# Patient Record
Sex: Male | Born: 1970 | Race: White | Hispanic: No | Marital: Married | Smoking: Never smoker
Health system: Southern US, Community
[De-identification: ages and names within clinical notes are randomized; demographics above are authoritative.]

## PROBLEM LIST (undated history)

## (undated) DIAGNOSIS — N200 Calculus of kidney: Secondary | ICD-10-CM

## (undated) HISTORY — PX: OTHER SURGICAL HISTORY: SHX169

## (undated) HISTORY — PX: HERNIA REPAIR: SHX51

---

## 2013-01-05 ENCOUNTER — Ambulatory Visit (INDEPENDENT_AMBULATORY_CARE_PROVIDER_SITE_OTHER): Payer: 59 | Admitting: Internal Medicine

## 2013-01-05 VITALS — BP 122/88 | HR 72 | Temp 98.6°F | Resp 16 | Ht 66.75 in | Wt 186.4 lb

## 2013-01-05 DIAGNOSIS — Z0000 Encounter for general adult medical examination without abnormal findings: Secondary | ICD-10-CM

## 2013-01-05 DIAGNOSIS — Z23 Encounter for immunization: Secondary | ICD-10-CM

## 2013-01-05 DIAGNOSIS — Z79899 Other long term (current) drug therapy: Secondary | ICD-10-CM

## 2013-01-05 LAB — PSA: PSA: 0.77 ng/mL (ref <=4.00)

## 2013-01-05 LAB — POCT URINALYSIS DIPSTICK
Blood, UA: NEGATIVE
Glucose, UA: NEGATIVE

## 2013-01-05 LAB — COMPREHENSIVE METABOLIC PANEL
ALT: 31 U/L (ref 0–53)
AST: 23 U/L (ref 0–37)
Albumin: 4.6 g/dL (ref 3.5–5.2)
BUN: 12 mg/dL (ref 6–23)
CO2: 28 mEq/L (ref 19–32)
Calcium: 9.4 mg/dL (ref 8.4–10.5)
Chloride: 101 mEq/L (ref 96–112)
Potassium: 4.2 mEq/L (ref 3.5–5.3)

## 2013-01-05 LAB — POCT CBC
HCT, POC: 48.5 % (ref 43.5–53.7)
Hemoglobin: 15.7 g/dL (ref 14.1–18.1)
Lymph, poc: 1.5 (ref 0.6–3.4)
MCH, POC: 29.8 pg (ref 27–31.2)
MCHC: 32.4 g/dL (ref 31.8–35.4)
MCV: 92.2 fL (ref 80–97)
MPV: 7.6 fL (ref 0–99.8)
POC MID %: 6 %M (ref 0–12)
RBC: 5.26 M/uL (ref 4.69–6.13)
WBC: 7.5 10*3/uL (ref 4.6–10.2)

## 2013-01-05 LAB — LIPID PANEL: HDL: 38 mg/dL — ABNORMAL LOW (ref >39)

## 2013-01-05 NOTE — Progress Notes (Signed)
  Subjective:    Patient ID: Tyler Blevins, male    DOB: 1971-09-05, 42 y.o.   MRN: [id number]  HPI Healthy hx/see scanned hx form   Review of Systems  Constitutional: Negative.   HENT: Negative.   Eyes: Negative.   Respiratory: Negative.   Gastrointestinal: Negative.   Genitourinary: Negative.   Neurological: Negative.   Hematological: Negative.   Psychiatric/Behavioral: Negative.        Objective:   Physical Exam  Vitals reviewed. Constitutional: He is oriented to person, place, and time. He appears well-developed and well-nourished.  HENT:  Right Ear: External ear normal.  Left Ear: External ear normal.  Nose: Nose normal.  Mouth/Throat: Oropharynx is clear and moist.  Eyes: EOM are normal. Pupils are equal, round, and reactive to light. No scleral icterus.  Neck: Normal range of motion. No thyromegaly present.  Cardiovascular: Normal rate, regular rhythm and normal heart sounds.   Pulmonary/Chest: Effort normal and breath sounds normal.  Abdominal: Bowel sounds are normal. He exhibits no mass. There is no tenderness.  Genitourinary: Rectum normal, prostate normal and penis normal.  Musculoskeletal: Normal range of motion.  Lymphadenopathy:    He has no cervical adenopathy.  Neurological: He is alert and oriented to person, place, and time. He has normal reflexes. No cranial nerve deficit. He exhibits normal muscle tone. Coordination normal.  Skin: Skin is warm and dry.  Psychiatric: He has a normal mood and affect. His behavior is normal. Judgment and thought content normal.   Results for orders placed in visit on 01/05/13  POCT CBC      Component Value Range   WBC 7.5  4.6 - 10.2 K/uL   Lymph, poc 1.5  0.6 - 3.4   POC LYMPH PERCENT 19.9  10 - 50 %L   MID (cbc) 0.4  0 - 0.9   POC MID % 6.0  0 - 12 %M   POC Granulocyte 5.6  2 - 6.9   Granulocyte percent 74.1  37 - 80 %G   RBC 5.26  4.69 - 6.13 M/uL   Hemoglobin 15.7  14.1 - 18.1 g/dL   HCT, POC 82.9  56.2 - 53.7  %   MCV 92.2  80 - 97 fL   MCH, POC 29.8  27 - 31.2 pg   MCHC 32.4  31.8 - 35.4 g/dL   RDW, POC 13.0     Platelet Count, POC 302  142 - 424 K/uL   MPV 7.6  0 - 99.8 fL  POCT URINALYSIS DIPSTICK      Component Value Range   Color, UA yellow     Clarity, UA clear     Glucose, UA neg     Bilirubin, UA       Ketones, UA       Spec Grav, UA 1.015     Blood, UA neg     pH, UA       Protein, UA neg     Urobilinogen, UA       Nitrite, UA       Leukocytes, UA        ekg normal       Assessment & Plan:  Healthy Flu  vaccine

## 2013-01-05 NOTE — Progress Notes (Signed)
  Subjective:    Patient ID: Tyler Blevins, male    DOB: 1971-11-04, 42 y.o.   MRN: [id number]  HPI    Review of Systems  Constitutional: Negative.   HENT: Negative.   Eyes: Negative.   Respiratory: Negative.   Cardiovascular: Negative.   Gastrointestinal: Negative.   Genitourinary: Negative.   Musculoskeletal: Negative.   Skin: Negative.   Neurological: Negative.   Hematological: Negative.   Psychiatric/Behavioral: Negative.        Objective:   Physical Exam        Assessment & Plan:

## 2013-01-05 NOTE — Patient Instructions (Signed)

## 2015-02-26 ENCOUNTER — Ambulatory Visit (INDEPENDENT_AMBULATORY_CARE_PROVIDER_SITE_OTHER): Payer: Self-pay | Admitting: Family Medicine

## 2015-02-26 VITALS — BP 132/84 | HR 75 | Temp 97.7°F | Resp 16 | Ht 68.5 in | Wt 188.4 lb

## 2015-02-26 DIAGNOSIS — Z008 Encounter for other general examination: Secondary | ICD-10-CM

## 2015-02-26 DIAGNOSIS — Z021 Encounter for pre-employment examination: Secondary | ICD-10-CM

## 2015-02-26 DIAGNOSIS — Z0289 Encounter for other administrative examinations: Secondary | ICD-10-CM

## 2015-02-26 NOTE — Progress Notes (Signed)
Commercial Driver Medical Examination   Tyler Blevins is a 44 y.o. male who presents today for a commercial driver fitness determination physical exam. The patient reports no problems. The following portions of the patient's history were reviewed and updated as appropriate: allergies, current medications, past family history, past medical history, past social history, past surgical history and problem list. Sees Dr. Leonette MostKalish w/ [facility] A comprehensive review of systems was negative.   Objective:    Vision:  Visual Acuity Screening   Right eye Left eye Both eyes  Without correction: 20/25 20/20 20/20-1  With correction:     Comments: .The patient can distinguish the colors red, amber and green.  Periferal vision is 85 degrees in both eyes.     Applicant can recognize and distinguish among traffic control signals and devices showing standard red, green, and amber colors.     Monocular Vision?: No   Hearing  Hearing Screening Comments: .The patient was able to hear a forced whisper from 10 feet.   :   BP 132/84 mmHg  Pulse 75  Temp(Src) 97.7 F (36.5 C) (Oral)  Resp 16  Ht 5' 8.5" (1.74 m)  Wt 188 lb 6.4 oz (85.458 kg)  BMI 28.23 kg/m2  SpO2 98%  General Appearance:    Alert, cooperative, no distress, appears stated age  Head:    Normocephalic, without obvious abnormality, atraumatic  Eyes:    PERRL, conjunctiva/corneas clear, EOM's intact, fundi    benign, both eyes       Ears:    Normal TM's and external ear canals, both ears  Nose:   Nares normal, septum midline, mucosa normal, no drainage    or sinus tenderness  Throat:   Lips, mucosa, and tongue normal; teeth and gums normal  Neck:   Supple, symmetrical, trachea midline, no adenopathy;       thyroid:  No enlargement/tenderness/nodules; no carotid   bruit or JVD  Back:     Symmetric, no curvature, ROM normal, no CVA tenderness  Lungs:     Clear to auscultation bilaterally, respirations  unlabored  Chest wall:    No tenderness or deformity  Heart:    Regular rate and rhythm, S1 and S2 normal, no murmur, rub   or gallop  Abdomen:     Soft, non-tender, bowel sounds active all four quadrants,    no masses, no organomegaly  Genitalia:    Normal male without lesion, discharge or tenderness  Rectal:    Normal tone, normal prostate, no masses or tenderness;   guaiac negative stool  Extremities:   Extremities normal, atraumatic, no cyanosis or edema  Pulses:   2+ and symmetric all extremities  Skin:   Skin color, texture, turgor normal, no rashes or lesions  Lymph nodes:   Cervical, supraclavicular, and axillary nodes normal  Neurologic:   CNII-XII intact. Normal strength, sensation and reflexes      throughout    Labs:   UA: SG 1.005, neg gluc, neg prot, neg blood  Assessment:    Healthy male exam.  Meets standards in 9549 CFR 391.41;  qualifies for 2 year certificate.    Plan:    Medical examiners certificate completed and printed. Return as needed.

## 2017-03-08 ENCOUNTER — Emergency Department (HOSPITAL_BASED_OUTPATIENT_CLINIC_OR_DEPARTMENT_OTHER): Payer: 59

## 2017-03-08 ENCOUNTER — Encounter (HOSPITAL_BASED_OUTPATIENT_CLINIC_OR_DEPARTMENT_OTHER): Payer: Self-pay | Admitting: *Deleted

## 2017-03-08 ENCOUNTER — Emergency Department (HOSPITAL_BASED_OUTPATIENT_CLINIC_OR_DEPARTMENT_OTHER)
Admission: EM | Admit: 2017-03-08 | Discharge: 2017-03-08 | Disposition: A | Discharge status: Home / Self Care | Payer: 59 | Attending: Emergency Medicine | Admitting: Emergency Medicine

## 2017-03-08 DIAGNOSIS — R1032 Left lower quadrant pain: Secondary | ICD-10-CM | POA: Diagnosis present

## 2017-03-08 DIAGNOSIS — N200 Calculus of kidney: Secondary | ICD-10-CM | POA: Diagnosis not present

## 2017-03-08 DIAGNOSIS — Z79899 Other long term (current) drug therapy: Secondary | ICD-10-CM | POA: Insufficient documentation

## 2017-03-08 HISTORY — DX: Calculus of kidney: N20.0

## 2017-03-08 LAB — URINALYSIS, MICROSCOPIC (REFLEX)

## 2017-03-08 LAB — CBC WITH DIFFERENTIAL/PLATELET
BASOS ABS: 0 10*3/uL (ref 0.0–0.1)
Basophils Relative: 1 %
Eosinophils Absolute: 0.1 10*3/uL (ref 0.0–0.7)
Eosinophils Relative: 1 %
HEMATOCRIT: 42 % (ref 39.0–52.0)
HEMOGLOBIN: 15.2 g/dL (ref 13.0–17.0)
LYMPHS ABS: 1.1 10*3/uL (ref 0.7–4.0)
LYMPHS PCT: 20 %
MCH: 30.8 pg (ref 26.0–34.0)
MCHC: 36.2 g/dL — ABNORMAL HIGH (ref 30.0–36.0)
MCV: 85.2 fL (ref 78.0–100.0)
Monocytes Absolute: 0.6 10*3/uL (ref 0.1–1.0)
Monocytes Relative: 10 %
NEUTROS ABS: 3.9 10*3/uL (ref 1.7–7.7)
Neutrophils Relative %: 68 %
Platelets: 256 10*3/uL (ref 150–400)
RBC: 4.93 MIL/uL (ref 4.22–5.81)
RDW: 12.3 % (ref 11.5–15.5)
WBC: 5.7 10*3/uL (ref 4.0–10.5)

## 2017-03-08 LAB — BASIC METABOLIC PANEL
Anion gap: 7 (ref 5–15)
BUN: 13 mg/dL (ref 6–20)
CHLORIDE: 105 mmol/L (ref 101–111)
CO2: 24 mmol/L (ref 22–32)
Calcium: 9.2 mg/dL (ref 8.9–10.3)
Creatinine, Ser: 1.04 mg/dL (ref 0.61–1.24)
GFR calc Af Amer: >60 mL/min (ref >60)
GLUCOSE: 124 mg/dL — AB (ref 65–99)
POTASSIUM: 3.7 mmol/L (ref 3.5–5.1)
Sodium: 136 mmol/L (ref 135–145)

## 2017-03-08 LAB — URINALYSIS, ROUTINE W REFLEX MICROSCOPIC
Bilirubin Urine: NEGATIVE
GLUCOSE, UA: NEGATIVE mg/dL
Ketones, ur: NEGATIVE mg/dL
LEUKOCYTES UA: NEGATIVE
Nitrite: NEGATIVE
PH: 5.5 (ref 5.0–8.0)
PROTEIN: NEGATIVE mg/dL
SPECIFIC GRAVITY, URINE: 1.022 (ref 1.005–1.030)

## 2017-03-08 NOTE — ED Triage Notes (Signed)
Pt reports he took 4 motrin at 0600

## 2017-03-08 NOTE — ED Notes (Signed)
Patient transported to CT 

## 2017-03-08 NOTE — ED Notes (Signed)
ED Provider at bedside. 

## 2017-03-08 NOTE — ED Notes (Signed)
Urine strainer given for home use

## 2017-03-08 NOTE — ED Provider Notes (Signed)
[facility]   CSN: [id number] Arrival date & time: 03/08/17  1324     History   Chief Complaint Chief Complaint  Patient presents with  . Flank Pain    HPI Tyler Blevins is a 46 y.o. male.  HPI Patient presented with severe left lower flank pain. States that had him in tears. States he had a bowel movement was unable to urinate. Had nausea. No fevers. History of kidney stones around 15 years ago. States while he was in triage the pain went away. Feels much better now. No testicular pain. Took ibuprofen at home.   Past Medical History:  Diagnosis Date  . Kidney stone     There are no active problems to display for this patient.   Past Surgical History:  Procedure Laterality Date  . HERNIA REPAIR         Home Medications    Prior to Admission medications   Medication Sig Start Date End Date Taking? Authorizing Provider  Multiple Vitamin (MULTIVITAMIN) tablet Take 1 tablet by mouth daily.    Historical Provider, MD  OVER THE COUNTER MEDICATION Vitamin C taking daily    Historical Provider, MD  OVER THE COUNTER MEDICATION Vitamin B taking daily    Historical Provider, MD  OVER THE COUNTER MEDICATION Mega Red taking daily    Historical Provider, MD    Family History Family History  Problem Relation Age of Onset  . Heart disease Mother   . Cancer Father     Social History Social History  Substance Use Topics  . Smoking status: Never Smoker  . Smokeless tobacco: Never Used  . Alcohol use Yes     Comment: weekly     Allergies   Patient has no known allergies.   Review of Systems Review of Systems  Constitutional: Negative for appetite change, chills and fever.  HENT: Negative for congestion.   Eyes: Negative for pain.  Respiratory: Negative for cough.   Gastrointestinal: Positive for abdominal pain and nausea.  Endocrine: Negative for polyuria.  Genitourinary: Positive for difficulty urinating and flank pain.    Musculoskeletal: Positive for back pain.  Skin: Negative for rash.  Neurological: Negative for weakness and light-headedness.  Hematological: Negative for adenopathy.  Psychiatric/Behavioral: Negative for confusion.     Physical Exam Updated Vital Signs BP (!) 158/101 (BP Location: Left Arm)   Pulse 64   Temp 98.7 F (37.1 C) (Oral)   Resp 18   Ht 5\' 8"  (1.727 m)   Wt 165 lb (74.8 kg)   SpO2 95%   BMI 25.09 kg/m   Physical Exam  Constitutional: He appears well-developed.  HENT:  Head: Atraumatic.  Neck: Neck supple.  Cardiovascular: Normal rate.   Pulmonary/Chest: Effort normal.  Abdominal: Soft. There is no tenderness.  Genitourinary:  Genitourinary Comments: No CVA tenderness  Musculoskeletal: Normal range of motion.  Neurological: He is alert.  Skin: Skin is warm. Capillary refill takes less than 2 seconds.     ED Treatments / Results  Labs (all labs ordered are listed, but only abnormal results are displayed) Labs Reviewed  CBC WITH DIFFERENTIAL/PLATELET - Abnormal; Notable for the following:       Result Value   MCHC 36.2 (*)    All other components within normal limits  BASIC METABOLIC PANEL - Abnormal; Notable for the following:    Glucose, Bld 124 (*)    All other components within normal limits  URINALYSIS, ROUTINE W REFLEX MICROSCOPIC - Abnormal; Notable  for the following:    Hgb urine dipstick LARGE (*)    All other components within normal limits  URINALYSIS, MICROSCOPIC (REFLEX) - Abnormal; Notable for the following:    Bacteria, UA FEW (*)    Squamous Epithelial / LPF 0-5 (*)    All other components within normal limits    EKG  EKG Interpretation None       Radiology Ct Renal Stone Study  Result Date: 03/08/2017 CLINICAL DATA:  Left flank pain EXAM: CT ABDOMEN AND PELVIS WITHOUT CONTRAST TECHNIQUE: Multidetector CT imaging of the abdomen and pelvis was performed following the standard protocol without IV contrast. COMPARISON:  None.  FINDINGS: Lower chest: Lung bases are clear. No effusions. Heart is normal size. Hepatobiliary: No focal hepatic abnormality. Gallbladder unremarkable. Pancreas: No focal abnormality or ductal dilatation. Spleen: No focal abnormality.  Normal size. Adrenals/Urinary Tract: Small bilateral punctate nonobstructing renal stones. No ureteral stones or hydronephrosis. Small 2 mm stone layering dependently in the bladder. Adrenal glands unremarkable. Stomach/Bowel: Appendix is normal. Stomach, large and small bowel grossly unremarkable. Vascular/Lymphatic: No evidence of aneurysm or adenopathy. Reproductive: Mild prostate enlargement with central calcifications. Other: No free fluid or free air. Musculoskeletal: No acute bony abnormality. IMPRESSION: Bilateral nephrolithiasis. Small layering stone in the urinary bladder. Mild prostate enlargement with central calcifications. Electronically Signed   By: Charlett NoseKevin  Dover M.D.   On: 03/08/2017 09:14    Procedures Procedures (including critical care time)  Medications Ordered in ED Medications - No data to display   Initial Impression / Assessment and Plan / ED Course  I have reviewed the triage vital signs and the nursing notes.  Pertinent labs & imaging results that were available during my care of the patient were reviewed by me and considered in my medical decision making (see chart for details).     Patient with kidney stone. Passed in the bladder wall he was in triage. Feels better. No infection. Will discharge home with urology follow-up.  Final Clinical Impressions(s) / ED Diagnoses   Final diagnoses:  Kidney stone    New Prescriptions New Prescriptions   No medications on file     Benjiman CoreNathan Novis League, MD 03/08/17 1001

## 2017-03-08 NOTE — ED Triage Notes (Signed)
Left flank pain since 0200. Reports difficulty urinating. Hx of kidney stone

## 2017-09-26 ENCOUNTER — Emergency Department (HOSPITAL_BASED_OUTPATIENT_CLINIC_OR_DEPARTMENT_OTHER): Payer: 59

## 2017-09-26 ENCOUNTER — Encounter (HOSPITAL_BASED_OUTPATIENT_CLINIC_OR_DEPARTMENT_OTHER): Payer: Self-pay | Admitting: Emergency Medicine

## 2017-09-26 ENCOUNTER — Emergency Department (HOSPITAL_BASED_OUTPATIENT_CLINIC_OR_DEPARTMENT_OTHER)
Admission: EM | Admit: 2017-09-26 | Discharge: 2017-09-27 | Disposition: A | Discharge status: Home / Self Care | Payer: 59 | Attending: Emergency Medicine | Admitting: Emergency Medicine

## 2017-09-26 DIAGNOSIS — R0789 Other chest pain: Secondary | ICD-10-CM | POA: Diagnosis not present

## 2017-09-26 DIAGNOSIS — Z79899 Other long term (current) drug therapy: Secondary | ICD-10-CM | POA: Insufficient documentation

## 2017-09-26 DIAGNOSIS — R079 Chest pain, unspecified: Secondary | ICD-10-CM | POA: Diagnosis present

## 2017-09-26 LAB — BASIC METABOLIC PANEL
Anion gap: 9 (ref 5–15)
BUN: 12 mg/dL (ref 6–20)
CHLORIDE: 105 mmol/L (ref 101–111)
CO2: 24 mmol/L (ref 22–32)
CREATININE: 0.9 mg/dL (ref 0.61–1.24)
Calcium: 9.2 mg/dL (ref 8.9–10.3)
GFR calc Af Amer: >60 mL/min (ref >60)
GFR calc non Af Amer: >60 mL/min (ref >60)
Glucose, Bld: 120 mg/dL — ABNORMAL HIGH (ref 65–99)
POTASSIUM: 3.4 mmol/L — AB (ref 3.5–5.1)
SODIUM: 138 mmol/L (ref 135–145)

## 2017-09-26 LAB — CBC
HCT: 41.6 % (ref 39.0–52.0)
Hemoglobin: 14.7 g/dL (ref 13.0–17.0)
MCH: 30.7 pg (ref 26.0–34.0)
MCHC: 35.3 g/dL (ref 30.0–36.0)
MCV: 86.8 fL (ref 78.0–100.0)
PLATELETS: 281 10*3/uL (ref 150–400)
RBC: 4.79 MIL/uL (ref 4.22–5.81)
RDW: 12.9 % (ref 11.5–15.5)
WBC: 8.5 10*3/uL (ref 4.0–10.5)

## 2017-09-26 LAB — TROPONIN I: Troponin I: <0.03 ng/mL (ref <0.03)

## 2017-09-26 MED ORDER — LORAZEPAM 2 MG/ML IJ SOLN
0.5000 mg | Freq: Once | INTRAMUSCULAR | Status: AC
  Administered 2017-09-26: 0.5 mg via INTRAVENOUS
  Filled 2017-09-26: qty 1

## 2017-09-26 NOTE — ED Provider Notes (Signed)
[facility]   CSN: [id number] Arrival date & time: 09/26/17  1934     History   Chief Complaint Chief Complaint  Patient presents with  . Chest Pain    HPI Tyler Blevins is a 46 y.o. male.  46 yo M with a chief complaint of chest pain. This been going on for the past 3 hours or so. Describes it as pinpoint nothing seems to make it better or worse. Denies shortness of breath. Has had some diaphoresis and increased anxiety with it. Denies trauma denies cough or congestion. Denies smoking history hypertension hyperlipidemia or diabetes. Mom had MI in her early 7s. Patient denies any lower extremity edema denies recent surgery denies recent immobilization. Denies prolonged travel. Denies hemoptysis. Denies prior history of PE or DVT.   The history is provided by the patient.  Chest Pain   This is a recurrent problem. The current episode started 3 to 5 hours ago. The problem occurs constantly. The problem has not changed since onset.The pain is present in the lateral region. The pain is at a severity of 8/10. The pain is moderate. The quality of the pain is described as sharp and heavy. The pain does not radiate. Duration of episode(s) is 3 hours. Associated symptoms include diaphoresis. Pertinent negatives include no abdominal pain, no fever, no headaches, no palpitations, no shortness of breath and no vomiting. He has tried nothing for the symptoms. The treatment provided no relief.  Pertinent negatives for past medical history include no diabetes, no DVT, no hyperlipidemia, no hypertension, no MI and no PE.  His family medical history is significant for early MI (mom).    Past Medical History:  Diagnosis Date  . Kidney stone     There are no active problems to display for this patient.   Past Surgical History:  Procedure Laterality Date  . HERNIA REPAIR         Home Medications    Prior to Admission medications   Medication Sig Start Date End  Date Taking? Authorizing Provider  Multiple Vitamin (MULTIVITAMIN) tablet Take 1 tablet by mouth daily.    [provider]  OVER THE COUNTER MEDICATION Vitamin C taking daily    [provider]  OVER THE COUNTER MEDICATION Vitamin B taking daily    [provider]  OVER THE COUNTER MEDICATION Mega Red taking daily    [provider]    Family History Family History  Problem Relation Age of Onset  . Heart disease Mother   . Cancer Father     Social History Social History  Substance Use Topics  . Smoking status: Never Smoker  . Smokeless tobacco: Never Used  . Alcohol use Yes     Comment: weekly     Allergies   Patient has no known allergies.   Review of Systems Review of Systems  Constitutional: Positive for diaphoresis and fatigue. Negative for chills and fever.  HENT: Negative for congestion and facial swelling.   Eyes: Negative for discharge and visual disturbance.  Respiratory: Negative for shortness of breath.   Cardiovascular: Positive for chest pain. Negative for palpitations.  Gastrointestinal: Negative for abdominal pain, diarrhea and vomiting.  Musculoskeletal: Negative for arthralgias and myalgias.  Skin: Negative for color change and rash.  Neurological: Negative for tremors, syncope and headaches.  Psychiatric/Behavioral: Negative for confusion and dysphoric mood.     Physical Exam Updated Vital Signs BP 122/88   Pulse 71   Temp 98.6 F (37  C) (Oral)   Resp 16   Ht  (1.727 m)   Wt 72.6 kg (160 lb)   SpO2 98%   BMI 24.33 kg/m   Physical Exam  Constitutional: He is oriented to person, place, and time. He appears well-developed and well-nourished.  HENT:  Head: Normocephalic and atraumatic.  Eyes: Pupils are equal, round, and reactive to light. EOM are normal.  Neck: Normal range of motion. Neck supple. No JVD present.  Cardiovascular: Normal rate and regular rhythm.  Exam reveals no gallop and no friction  rub.   No murmur heard. Pulmonary/Chest: No respiratory distress. He has no wheezes. He exhibits no tenderness.  Abdominal: He exhibits no distension and no mass. There is no tenderness. There is no rebound and no guarding.  Musculoskeletal: Normal range of motion.  Neurological: He is alert and oriented to person, place, and time.  Skin: No rash noted. No pallor.  Psychiatric: He has a normal mood and affect. His behavior is normal.  Nursing note and vitals reviewed.    ED Treatments / Results  Labs (all labs ordered are listed, but only abnormal results are displayed) Labs Reviewed  BASIC METABOLIC PANEL - Abnormal; Notable for the following:       Result Value   Potassium 3.4 (*)    Glucose, Bld 120 (*)    All other components within normal limits  CBC  TROPONIN I  TROPONIN I    EKG  EKG Interpretation  Date/Time:   yo M With atypical chest pain. Will obtain a delta troponin.   Delta trop negative. D/c home.    11:27 PM:  I have discussed the diagnosis/risks/treatment options with the patient and family and believe the pt to be eligible for discharge home to follow-up with PCP. We also discussed returning to the ED immediately if new or worsening sx occur. We discussed the sx which are most concerning (e.g., sudden worsening pain, fever, inability to tolerate by mouth) that necessitate immediate return. Medications administered to the patient during their visit and any new prescriptions provided to the patient are listed below.  Medications given during this visit Medications  LORazepam (ATIVAN) injection 0.5 mg (0.5 mg Intravenous Given  09/26/17 2129)     The patient appears reasonably screen and/or stabilized for discharge and I doubt any other medical condition or other [facility] requiring further screening, evaluation, or treatment in the ED at this time prior to discharge.    Final Clinical Impressions(s) / ED Diagnoses   Final diagnoses:  Atypical chest pain    New Prescriptions New Prescriptions   No medications on file     Melene Plan, DO 09/26/17 2327

## 2017-09-26 NOTE — ED Notes (Signed)
EDP at [facility], 2nd troponin drawn.

## 2017-09-26 NOTE — ED Notes (Signed)
No changes, alert, NAD, calm, interactive."feel better".  No other changes.

## 2017-09-26 NOTE — Discharge Instructions (Signed)
Take 4 over the counter ibuprofen tablets 3 times a day or 2 over-the-counter naproxen tablets twice a day for pain. Also take tylenol 1000mg(2 extra strength) four times a day.    

## 2017-09-26 NOTE — ED Triage Notes (Signed)
Patient states that he has had pain to his left chest in "one specific spot" x 2 -3 hours. The patient is very anxious in triage. The patient reports som trouble taking a deep breath

## 2017-09-26 NOTE — ED Notes (Signed)
EDP into room, prior to RN assessment, see MD notes, pending orders.   Alert, NAD, calm, interactive, resps e/u, speaking in clear complete sentences, no dyspnea noted, skin W&D. Family at [facility]. VSS, NSR on monitor.

## 2018-07-05 ENCOUNTER — Emergency Department (HOSPITAL_BASED_OUTPATIENT_CLINIC_OR_DEPARTMENT_OTHER): Payer: No Typology Code available for payment source

## 2018-07-05 ENCOUNTER — Encounter (HOSPITAL_BASED_OUTPATIENT_CLINIC_OR_DEPARTMENT_OTHER): Payer: Self-pay | Admitting: Emergency Medicine

## 2018-07-05 ENCOUNTER — Other Ambulatory Visit: Payer: Self-pay

## 2018-07-05 ENCOUNTER — Emergency Department (HOSPITAL_BASED_OUTPATIENT_CLINIC_OR_DEPARTMENT_OTHER)
Admission: EM | Admit: 2018-07-05 | Discharge: 2018-07-05 | Disposition: A | Discharge status: Home / Self Care | Payer: No Typology Code available for payment source | Attending: Emergency Medicine | Admitting: Emergency Medicine

## 2018-07-05 DIAGNOSIS — S0083XA Contusion of other part of head, initial encounter: Secondary | ICD-10-CM | POA: Diagnosis not present

## 2018-07-05 DIAGNOSIS — S299XXA Unspecified injury of thorax, initial encounter: Secondary | ICD-10-CM | POA: Insufficient documentation

## 2018-07-05 DIAGNOSIS — Y92149 Unspecified place in prison as the place of occurrence of the external cause: Secondary | ICD-10-CM | POA: Insufficient documentation

## 2018-07-05 DIAGNOSIS — Y990 Civilian activity done for income or pay: Secondary | ICD-10-CM | POA: Diagnosis not present

## 2018-07-05 DIAGNOSIS — S0990XA Unspecified injury of head, initial encounter: Secondary | ICD-10-CM | POA: Diagnosis present

## 2018-07-05 DIAGNOSIS — Y9389 Activity, other specified: Secondary | ICD-10-CM | POA: Diagnosis not present

## 2018-07-05 DIAGNOSIS — S63635A Sprain of interphalangeal joint of left ring finger, initial encounter: Secondary | ICD-10-CM | POA: Diagnosis not present

## 2018-07-05 MED ORDER — ACETAMINOPHEN 325 MG PO TABS
650.0000 mg | ORAL_TABLET | Freq: Once | ORAL | Status: AC
  Administered 2018-07-05: 650 mg via ORAL
  Filled 2018-07-05: qty 2

## 2018-07-05 MED ORDER — ONDANSETRON 4 MG PO TBDP
4.0000 mg | ORAL_TABLET | Freq: Once | ORAL | Status: AC
  Administered 2018-07-05: 4 mg via ORAL
  Filled 2018-07-05: qty 1
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
## 2022-04-27 DEATH — deceased
